# Patient Record
Sex: Female | Born: 2000 | Race: Black or African American | Hispanic: No | Marital: Single | Smoking: Never smoker
Health system: Southern US, Community
[De-identification: ages and names within clinical notes are randomized; demographics above are authoritative.]

## PROBLEM LIST (undated history)

## (undated) DIAGNOSIS — Z789 Other specified health status: Secondary | ICD-10-CM

---
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
## 2022-01-08 IMAGING — US US PELVIS COMPLETE TRANSABD/TRANSVAG W DUPLEX
1 series · 13 of 25 positions shown · non-contrast
Comparison: None.

CLINICAL DATA: 19-year-old with right lower quadrant and
intermittent pelvic pain for 4 days.

EXAM:
TRANSABDOMINAL AND TRANSVAGINAL ULTRASOUND OF PELVIS
DOPPLER ULTRASOUND OF OVARIES
TECHNIQUE: Both transabdominal and transvaginal ultrasound examinations of the
pelvis were performed. Transabdominal technique was performed for
global imaging of the pelvis including uterus, ovaries, adnexal
regions, and pelvic cul-de-sac.
It was necessary to proceed with endovaginal exam following the
transabdominal exam to visualize the uterus, endometrium, and
adnexa. Color and duplex Doppler ultrasound was utilized to evaluate
blood flow to the ovaries.

[Series 1: us pelvic complete w transvaginal and torsion righ · 13 of 43 slices shown]
[im 1/43]
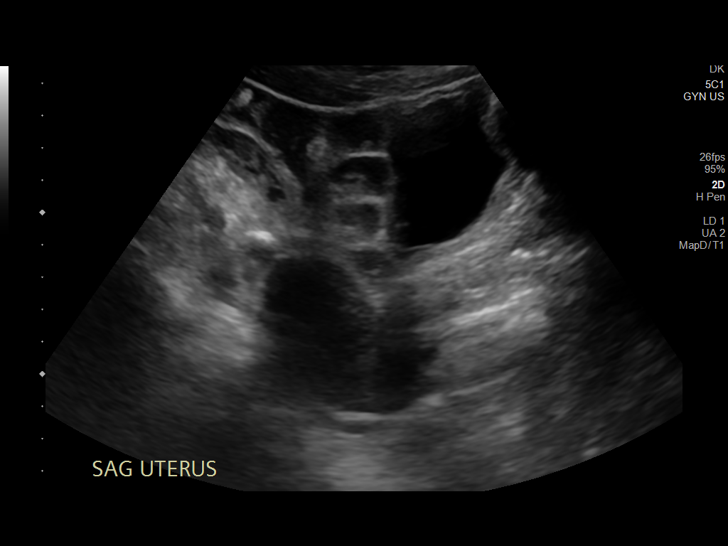
[im 4/43]
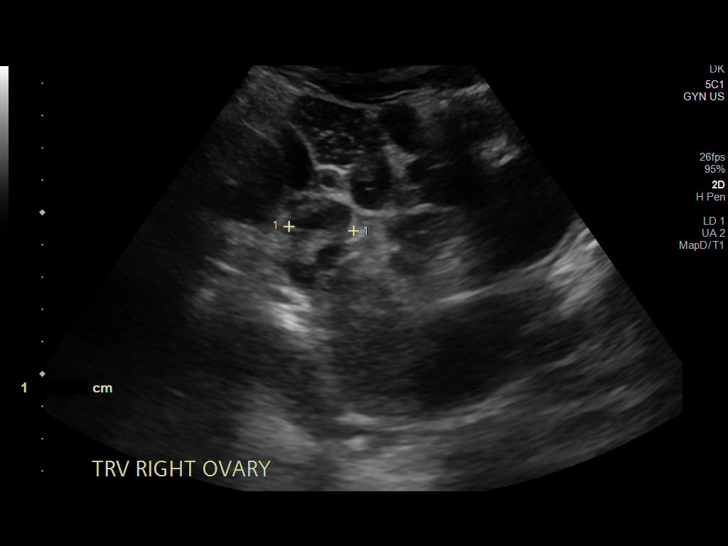
[im 8/43]
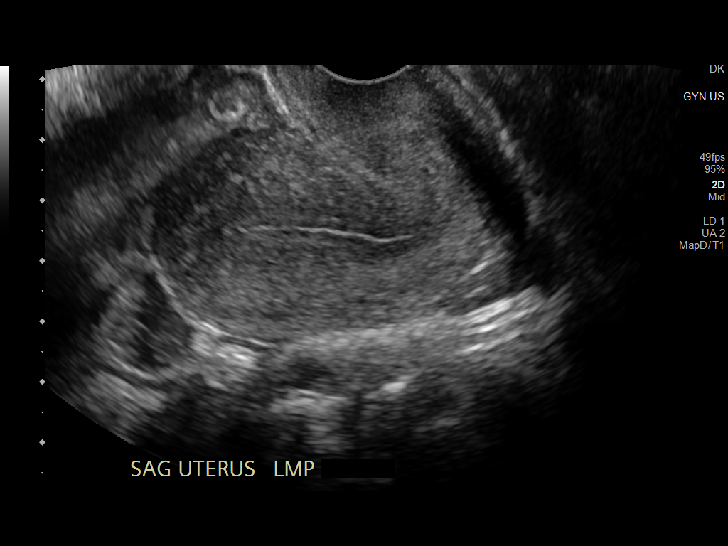
[im 11/43]
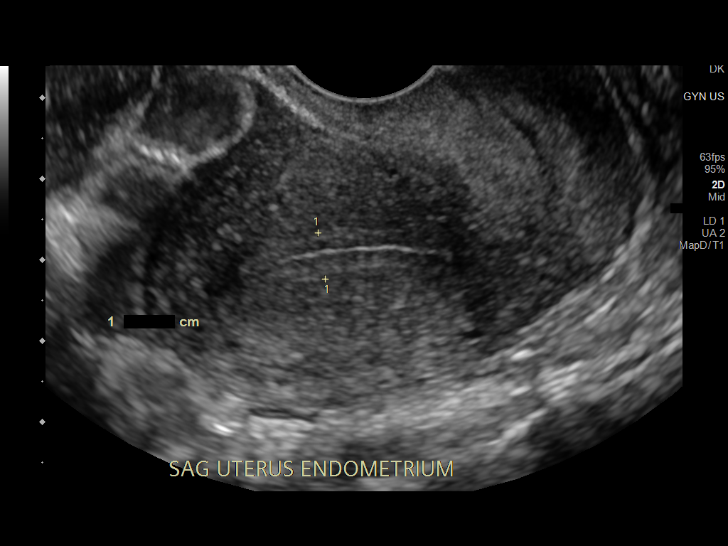
[im 15/43]
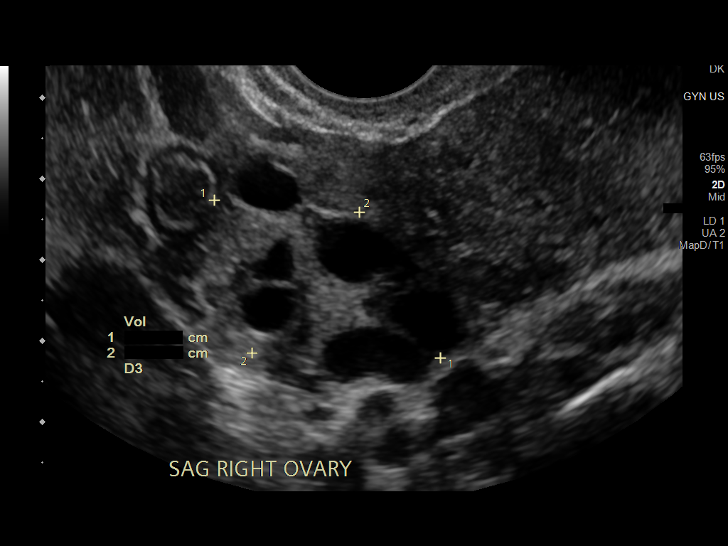
[im 18/43]
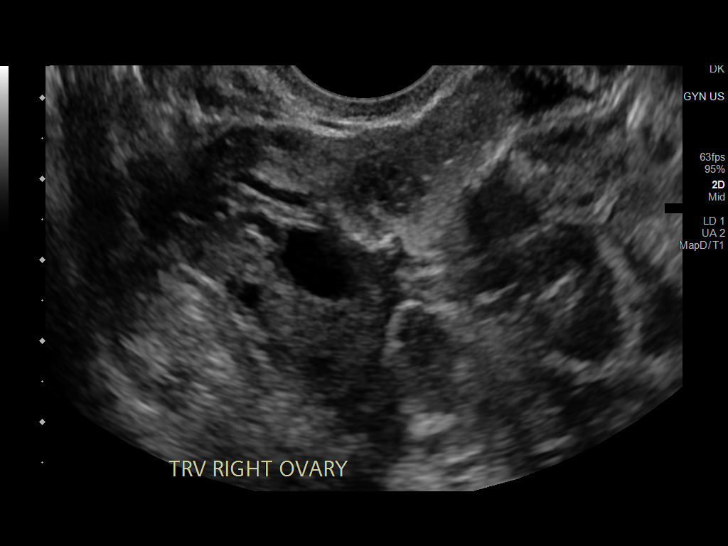
[im 22/43]
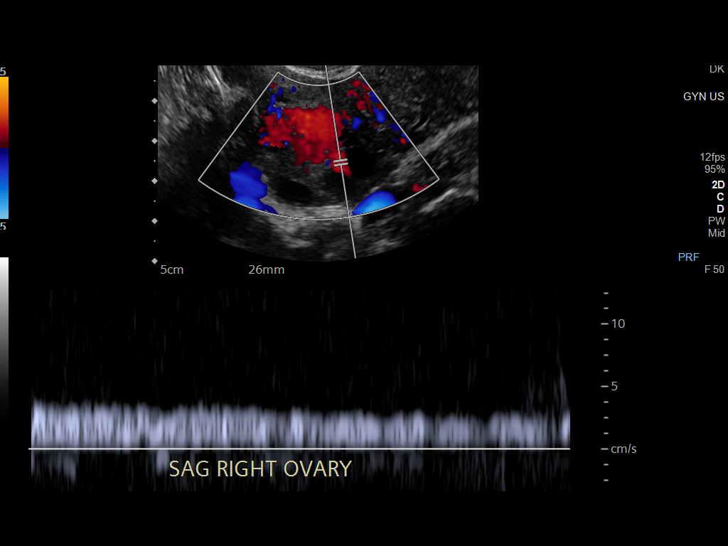
[im 25/43]
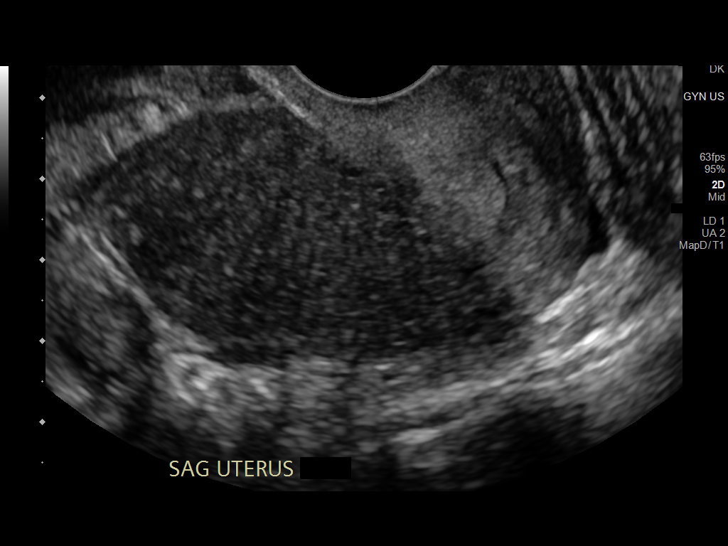
[im 29/43]
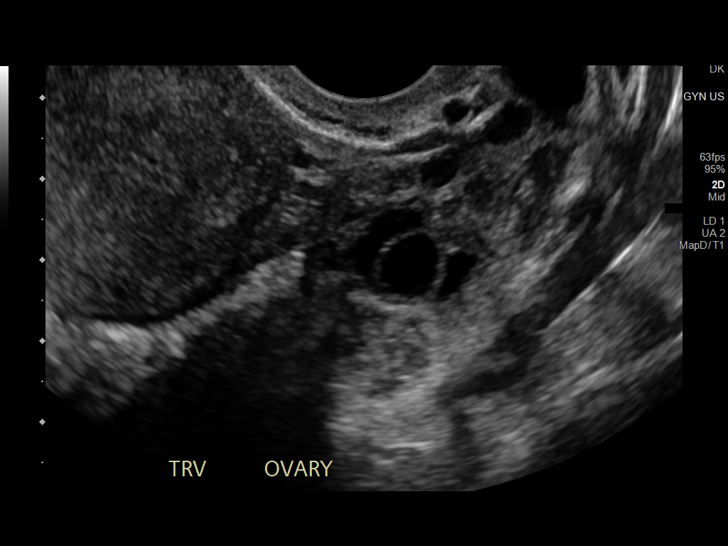
[im 32/43]
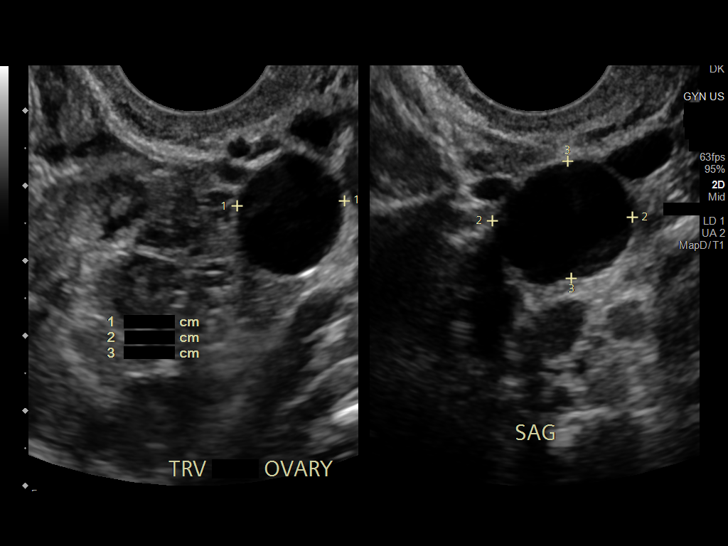
[im 36/43]
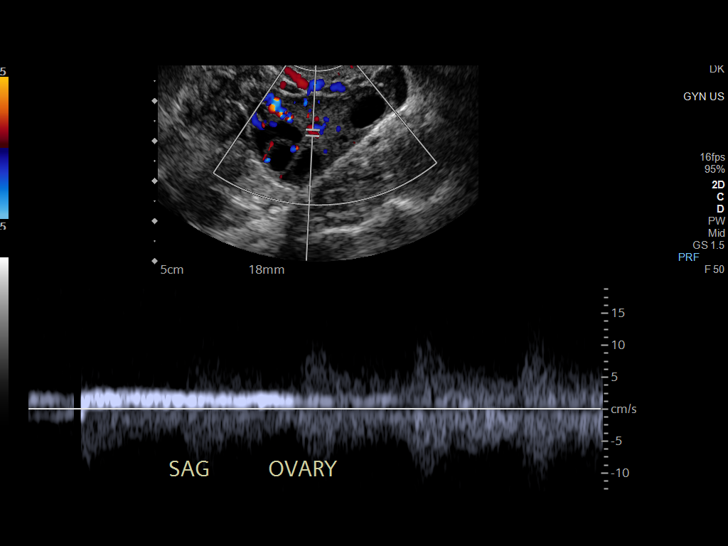
[im 39/43]
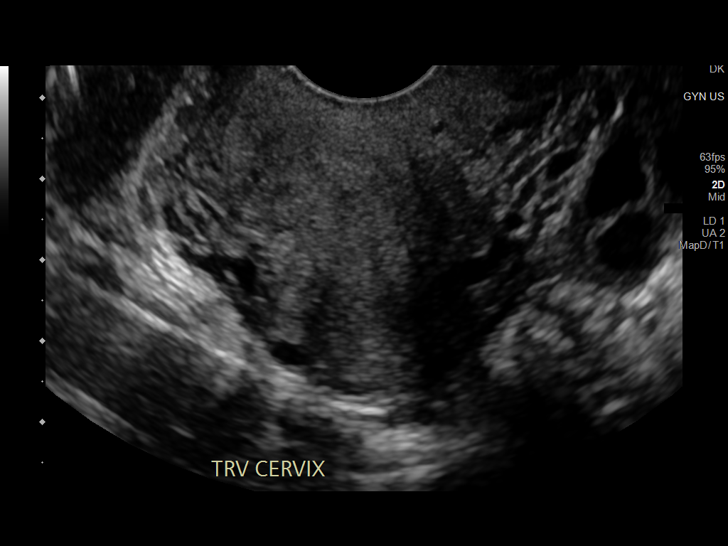
[im 43/43]
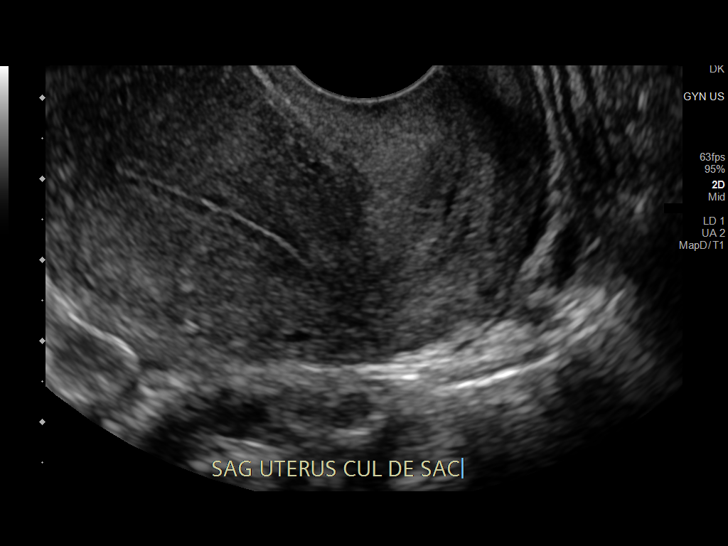

[13 of 25 positions shown; findings below may reference images not displayed]

FINDINGS: Uterus

Measurements: 6.2 x 3.5 x 4.0 cm = volume: 46 mL. The uterus is
anteverted. No fibroids or other mass visualized.

Endometrium

Thickness: 6 mm, normal.  No focal abnormality visualized.

Right ovary

Measurements: 3.4 x 2.2 x 2.5 cm = volume: 9.7 mL. Normal appearance
with physiologic follicles. Normal blood flow. No adnexal mass.

Left ovary

Measurements: 5.7 x 2.2 x 3.2 cm = volume: 21 mL. Multiple follicles
as well as small follicular cysts, largest measuring 1.9 cm. Normal
blood flow. No adnexal mass.

Pulsed Doppler evaluation of both ovaries demonstrates normal
low-resistance arterial and venous waveforms.

Other findings

No abnormal free fluid.
IMPRESSION: 1. Normal blood flow to both ovaries without torsion.
2. Physiologic follicles in both ovaries. Small follicular cysts in
the left ovary, largest measuring 1.9 cm, likely physiologic. No
dedicated further imaging follow-up is needed.
3. Normal sonographic appearance of the uterus and endometrium.

## 2022-01-08 IMAGING — CT CT ABD-PELV W/ CM
2 of 4 series · 15 of 46 positions shown, 17 images · IV contrast (omnipaque)
Comparison: Pelvic, renal, and appendix ultrasound earlier today.
Abdominal radiograph earlier today

CLINICAL DATA: Right lower quadrant abdominal pain for 4 days.
Fever. Vomiting and diarrhea.

EXAM:
CT ABDOMEN AND PELVIS WITH CONTRAST
TECHNIQUE: Multidetector CT imaging of the abdomen and pelvis was performed
using the standard protocol following bolus administration of
intravenous contrast.
CONTRAST:  100mL OMNIPAQUE IOHEXOL 300 MG/ML  SOLN

[Series 3: abdomen 5.0 · axial · 0.70mm/px · z∈[-621,-231]mm · 12 of 88 slices shown, 14 images]
[im 5/88  soft-tissue]
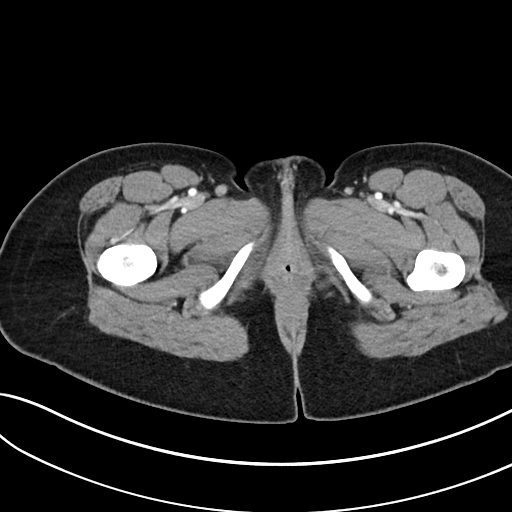
[im 5/88  bone]
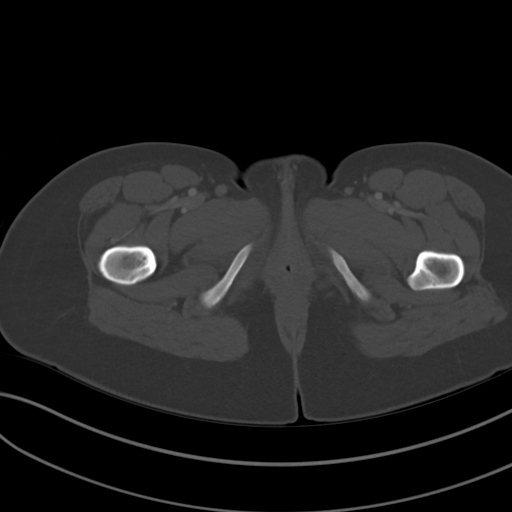
[im 14/88  soft-tissue]
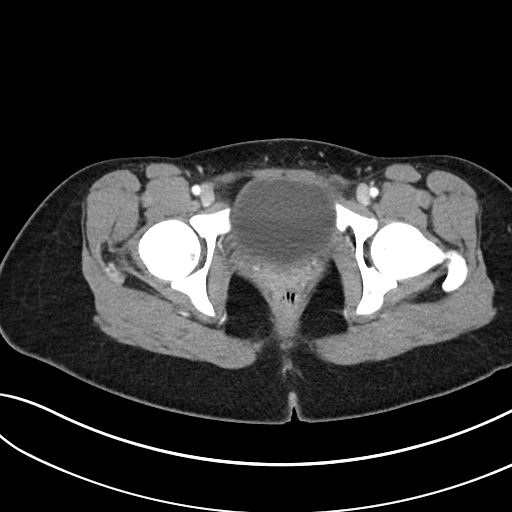
[im 19/88  soft-tissue]
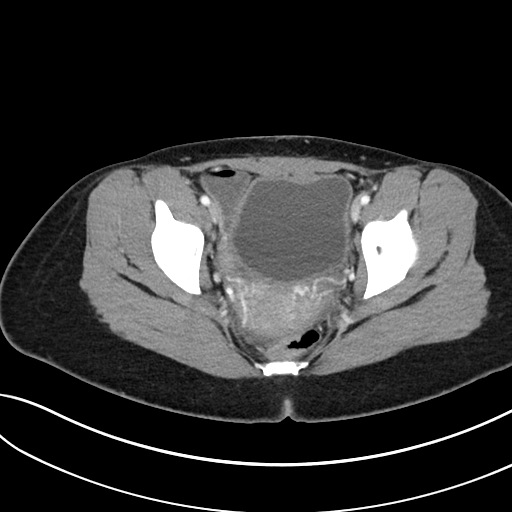
[im 28/88  soft-tissue]
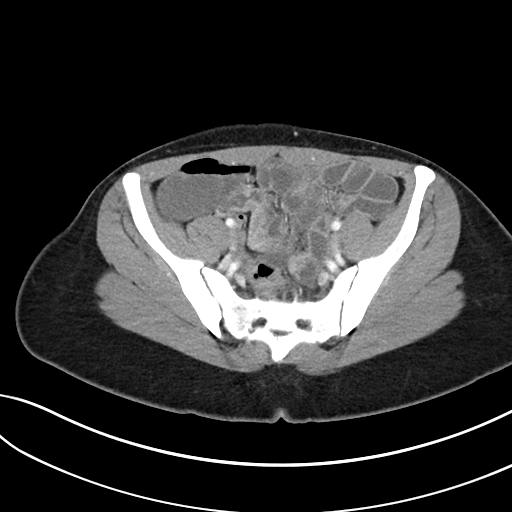
[im 33/88  soft-tissue]
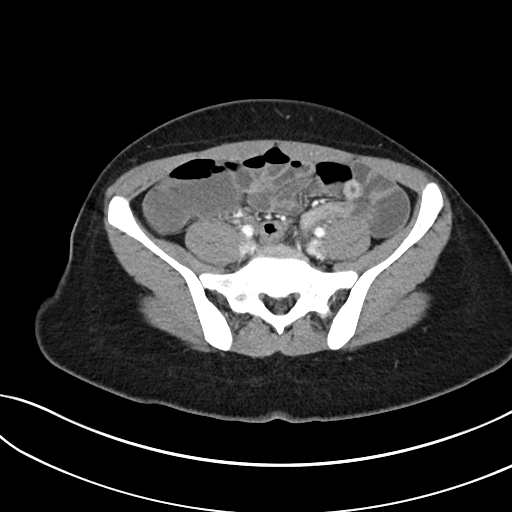
[im 42/88  soft-tissue]
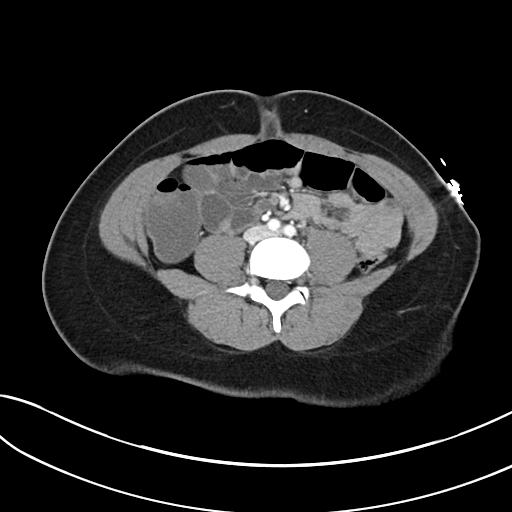
[im 46/88  soft-tissue]
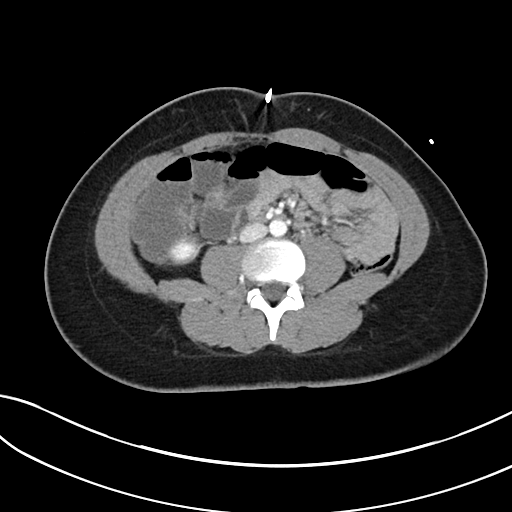
[im 55/88  soft-tissue]
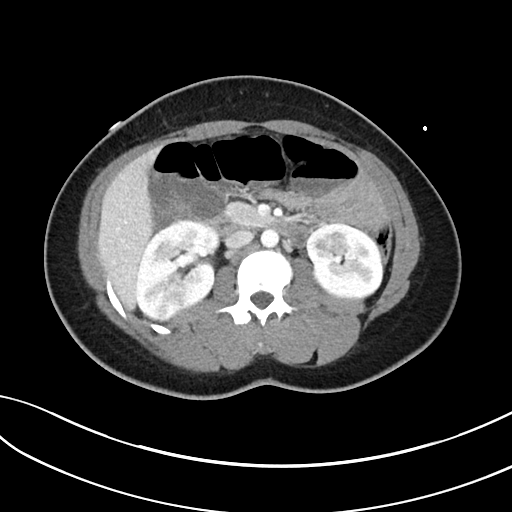
[im 60/88  soft-tissue]
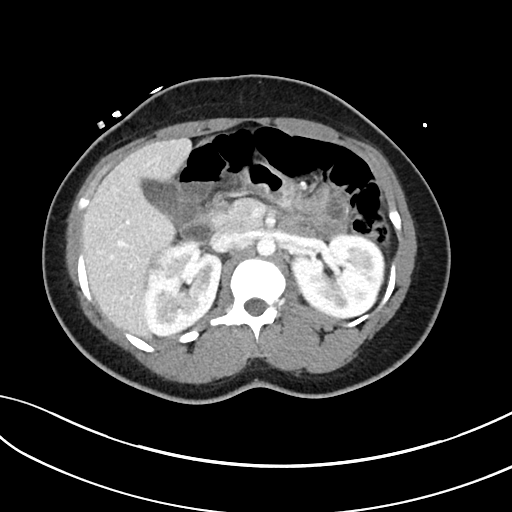
[im 60/88  bone]
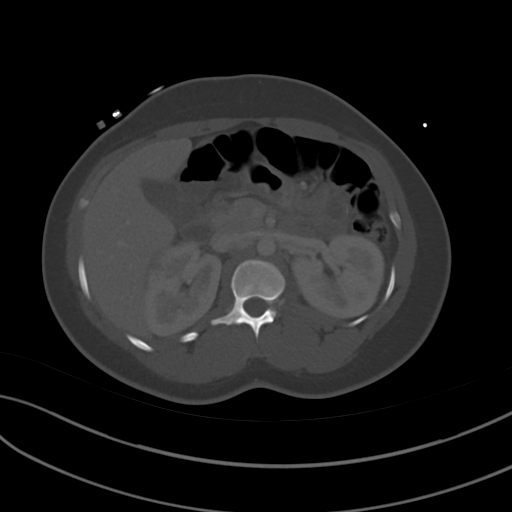
[im 69/88  soft-tissue]
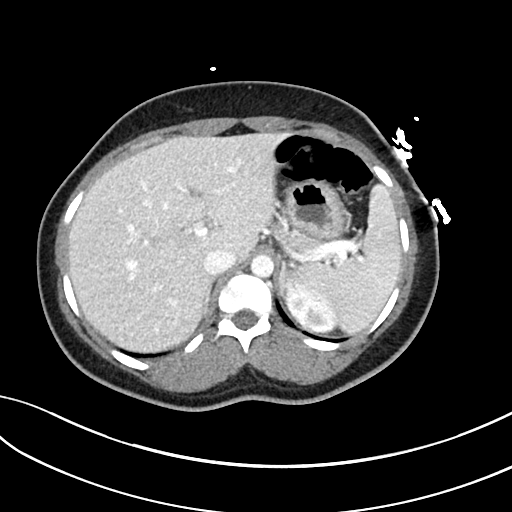
[im 74/88  soft-tissue]
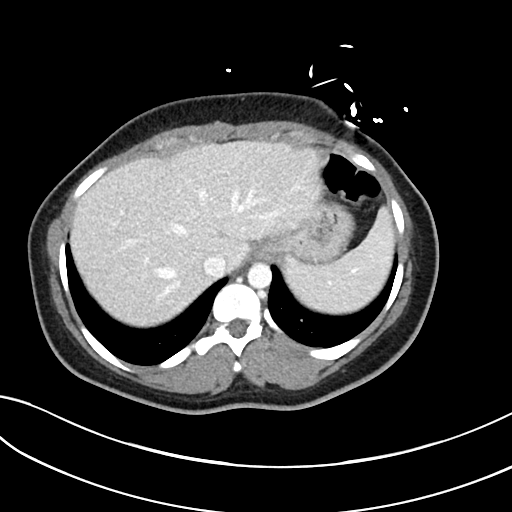
[im 83/88  soft-tissue]
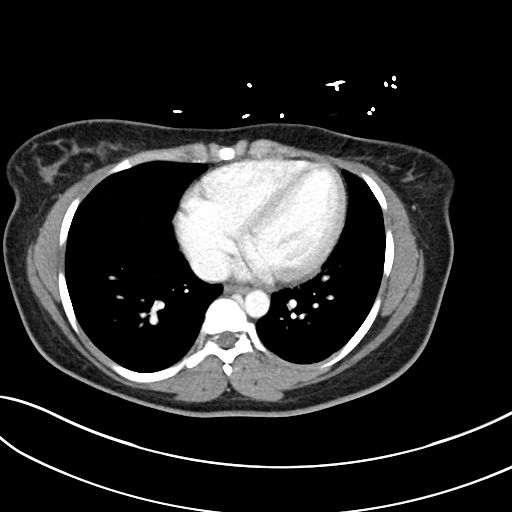

[Series 6: abdomen 3.0 mpr cor · coronal · 0.75mm/px · 3 of 83 slices shown]
[im 28/83  soft-tissue]
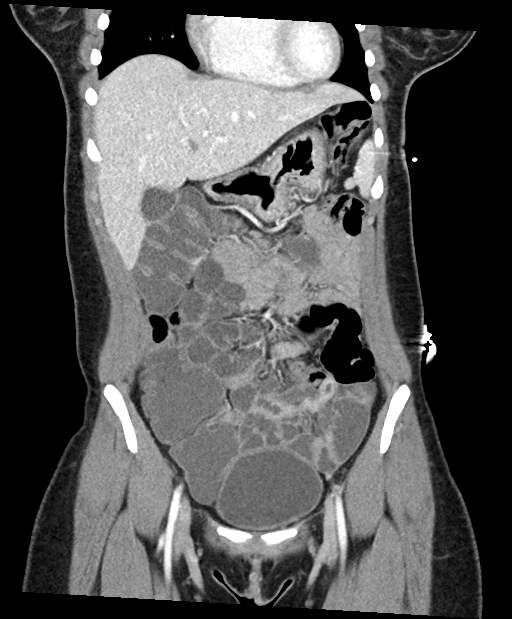
[im 37/83  soft-tissue]
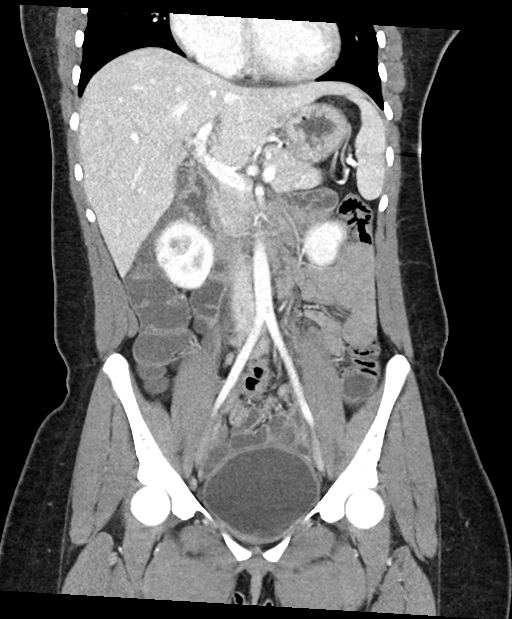
[im 46/83  soft-tissue]
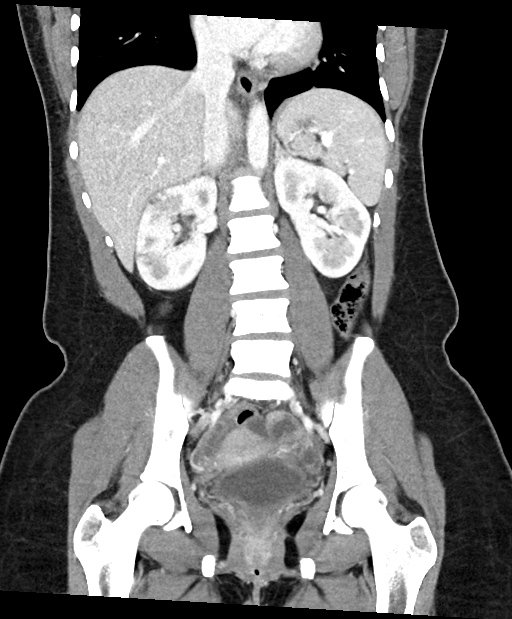

[15 of 46 positions shown; findings below may reference images not displayed]

FINDINGS: Lower chest: Minor hypoventilatory change dependently. No
consolidation or pleural fluid.

Hepatobiliary: No focal liver abnormality is seen. No gallstones,
gallbladder wall thickening, or biliary dilatation.

Pancreas: No ductal dilatation or inflammation.

Spleen: Normal in size without focal abnormality. Splenule
anteriorly.

Adrenals/Urinary Tract: Normal adrenal glands. Heterogeneous
enhancement of both kidneys, right greater than left with minimal
right perinephric edema. No evidence of focal renal fluid
collection. No hydronephrosis. Urinary bladder is physiologically
distended. There is no bladder wall thickening.

Stomach/Bowel: Normal air-filled appendix, for example series 3,
image 61. No appendicitis. Detailed bowel assessment is limited in
the absence of enteric contrast. Stomach is partially distended and
unremarkable. The ligament of Treitz is difficult to define in the
current exam, however likely normally position. Proximal small bowel
loops are decompressed. More distal small bowel loops are
fluid-filled, nonobstructive pattern. There is liquid stool/fluid in
the cecum, ascending, and transverse colon with air-fluid levels.
The more distal colon is decompressed. There is no colonic wall
thickening or inflammatory change.

Vascular/Lymphatic: No significant vascular findings are present. No
enlarged abdominal or pelvic lymph nodes.

Reproductive: Uterus and bilateral adnexa are unremarkable, assessed
on pelvic ultrasound earlier today.

Other: No free air. Trace free fluid in the pelvis without upper
abdominal ascites. There is a small fat containing umbilical hernia.
No focal fluid collection.

Musculoskeletal: There are no acute or suspicious osseous
abnormalities.
IMPRESSION: 1. Heterogeneous enhancement of both kidneys, right greater than
left, with minimal right perinephric edema, suspicious for
pyelonephritis. No focal renal fluid collection.
2. Fluid-filled large and small bowel with air-fluid levels,
suggesting enteritis/diarrheal illness. No bowel inflammation or
obstruction.
3. Normal appendix.
4. Small fat containing umbilical hernia.
